# Patient Record
Sex: Female | Born: 1965 | Race: Asian | Hispanic: No | Marital: Married | Smoking: Never smoker
Health system: Southern US, Community
[De-identification: ages and names within clinical notes are randomized; demographics above are authoritative.]

---

## 2014-10-12 ENCOUNTER — Other Ambulatory Visit: Payer: Self-pay | Admitting: Obstetrics and Gynecology

## 2014-10-12 DIAGNOSIS — Z1231 Encounter for screening mammogram for malignant neoplasm of breast: Secondary | ICD-10-CM

## 2014-10-28 ENCOUNTER — Encounter (HOSPITAL_COMMUNITY): Payer: Self-pay

## 2014-10-28 ENCOUNTER — Ambulatory Visit (HOSPITAL_COMMUNITY)
Admission: RE | Admit: 2014-10-28 | Discharge: 2014-10-28 | Disposition: A | Discharge status: Home / Self Care | Payer: Self-pay | Source: Ambulatory Visit | Attending: Obstetrics and Gynecology | Admitting: Obstetrics and Gynecology

## 2014-10-28 VITALS — BP 102/60 | Temp 98.2°F | Ht 61.0 in | Wt 120.4 lb

## 2014-10-28 DIAGNOSIS — Z1231 Encounter for screening mammogram for malignant neoplasm of breast: Secondary | ICD-10-CM

## 2014-10-28 DIAGNOSIS — Z1239 Encounter for other screening for malignant neoplasm of breast: Secondary | ICD-10-CM

## 2014-10-28 NOTE — Patient Instructions (Addendum)
Education materials given on self-breast awareness. Explained to Heather Hancock that she did not need a Pap smear today due to last Pap smear was 09/13/2014. Let her know BCCCP will cover Pap smears every 3 years unless has a history of abnormal Pap smears. Let patient know the [facility] will follow up with her within the next couple weeks with results by letter or phone. Heather Hancock verbalized understanding. Patient escorted to mammography for a screening mammogram.  Irvine Glorioso, Kathaleen Maserhristine Poll, RN 2:00 PM

## 2014-10-28 NOTE — Addendum Note (Signed)
Encounter addended by: Christine P Brannock, RN on: 10/28/2014  2:40 PM<BR>     Documentation filed: Patient Instructions Section

## 2014-10-28 NOTE — Progress Notes (Signed)
No complaints today.  Pap Smear:  Pap smear not completed today. Last Pap smear was 09/13/2014 at the free cervical cancer screening [facility] and normal. Per patient has no history of an abnormal Pap smear. No Pap smear results in EPIC.  Physical exam: Breasts Breasts symmetrical. No skin abnormalities bilateral breasts. No nipple retraction bilateral breasts. No nipple discharge bilateral breasts. No lymphadenopathy. No lumps palpated bilateral breasts. No complaints of pain or tenderness on exam. Patient escorted to mammography for a screening mammogram.        Pelvic/Bimanual No Pap smear completed today since last Pap smear was 09/13/2014. Pap smear not indicated per BCCCP guidelines.

## 2014-11-08 ENCOUNTER — Ambulatory Visit: Payer: Self-pay | Attending: Internal Medicine | Admitting: Internal Medicine

## 2014-11-08 ENCOUNTER — Encounter: Payer: Self-pay | Admitting: Internal Medicine

## 2014-11-08 VITALS — BP 122/79 | HR 60 | Temp 98.4°F | Resp 16 | Ht 61.0 in | Wt 122.0 lb

## 2014-11-08 DIAGNOSIS — Z0289 Encounter for other administrative examinations: Secondary | ICD-10-CM

## 2014-11-08 LAB — CBC WITH DIFFERENTIAL/PLATELET
Basophils Absolute: 0 10*3/uL (ref 0.0–0.1)
Basophils Relative: 0 % (ref 0–1)
EOS PCT: 3 % (ref 0–5)
Eosinophils Absolute: 0.2 10*3/uL (ref 0.0–0.7)
HEMATOCRIT: 40.9 % (ref 36.0–46.0)
HEMOGLOBIN: 13.8 g/dL (ref 12.0–15.0)
LYMPHS ABS: 2.1 10*3/uL (ref 0.7–4.0)
Lymphocytes Relative: 32 % (ref 12–46)
MCH: 29.7 pg (ref 26.0–34.0)
MCHC: 33.7 g/dL (ref 30.0–36.0)
MCV: 88.1 fL (ref 78.0–100.0)
MONO ABS: 0.3 10*3/uL (ref 0.1–1.0)
MONOS PCT: 5 % (ref 3–12)
MPV: 9.8 fL (ref 8.6–12.4)
NEUTROS PCT: 60 % (ref 43–77)
Neutro Abs: 4 10*3/uL (ref 1.7–7.7)
PLATELETS: 250 10*3/uL (ref 150–400)
RBC: 4.64 MIL/uL (ref 3.87–5.11)
RDW: 13.6 % (ref 11.5–15.5)
WBC: 6.7 10*3/uL (ref 4.0–10.5)

## 2014-11-08 LAB — COMPLETE METABOLIC PANEL WITH GFR
ALT: 24 U/L (ref 0–35)
AST: 25 U/L (ref 0–37)
Albumin: 3.7 g/dL (ref 3.5–5.2)
Alkaline Phosphatase: 62 U/L (ref 39–117)
BUN: 12 mg/dL (ref 6–23)
CALCIUM: 9.5 mg/dL (ref 8.4–10.5)
CHLORIDE: 101 meq/L (ref 96–112)
CO2: 26 mEq/L (ref 19–32)
Creat: 0.63 mg/dL (ref 0.50–1.10)
GFR, Est Non African American: >89 mL/min
GLUCOSE: 94 mg/dL (ref 70–99)
Potassium: 3.8 mEq/L (ref 3.5–5.3)
Sodium: 136 mEq/L (ref 135–145)
Total Bilirubin: 0.6 mg/dL (ref 0.2–1.2)
Total Protein: 6.5 g/dL (ref 6.0–8.3)

## 2014-11-08 NOTE — Progress Notes (Signed)
Patient ID: Heather Hancock, female   DOB: 10/01/1965, 49 y.o.   MRN: [id number]  [id number]  [id number]  DOB - May 07, 1966  CC:  Chief Complaint  Patient presents with  . Establish Care       HPI: Heather Hancock is a 49 y.o. [place] female here today to establish medical care.  Patient has no past medical history. She presents today to have immigration papers filled out. She recently moved to the [place] 3 months ago and states that she was never seen by the Health Department to begin a series of vaccines. She does not remember getting childhood vaccines in her country. She has no complaints today.   Patient has No headache, No chest pain, No abdominal pain - No Nausea, No new weakness tingling or numbness, No Cough - SOB.  Allergies  Allergen Reactions  . Penicillins    History reviewed. No pertinent past medical history. No current outpatient prescriptions on file prior to visit.   No current facility-administered medications on file prior to visit.   Family History  Problem Relation Age of Onset  . Breast cancer Sister    History   Social History  . Marital Status: Married    Spouse Name: N/A  . Number of Children: N/A  . Years of Education: N/A   Occupational History  . Not on file.   Social History Main Topics  . Smoking status: Never Smoker   . Smokeless tobacco: Never Used  . Alcohol Use: Yes     Comment: rarely  . Drug Use: No  . Sexual Activity: Yes    Birth Control/ Protection: None   Other Topics Concern  . Not on file   Social History Narrative    Review of Systems: Constitutional: Negative for fever, chills, diaphoresis, activity change, appetite change and fatigue. HENT: Negative for ear pain, nosebleeds, congestion, facial swelling, rhinorrhea, neck pain, neck stiffness and ear discharge.  Eyes: Negative for pain, discharge, redness, itching and visual disturbance. Respiratory: Negative for cough, choking, chest tightness, shortness of  breath, wheezing and stridor.  Cardiovascular: Negative for chest pain, palpitations and leg swelling. Gastrointestinal: Negative for abdominal distention. Genitourinary: Negative for dysuria, urgency, frequency, hematuria, flank pain, decreased urine volume, difficulty urinating and dyspareunia.  Musculoskeletal: Negative for back pain, joint swelling, arthralgia and gait problem. Neurological: Negative for dizziness, tremors, seizures, syncope, facial asymmetry, speech difficulty, weakness, light-headedness, numbness and headaches.  Hematological: Negative for adenopathy. Does not bruise/bleed easily. Psychiatric/Behavioral: Negative for hallucinations, behavioral problems, confusion, dysphoric mood, decreased concentration and agitation.    Objective:   Filed Vitals:   11/08/14 1418  BP: 122/79  Pulse: 60  Temp: 98.4 F (36.9 C)  Resp: 16    Physical Exam: Constitutional: Patient appears well-developed and well-nourished. No distress. HENT: Normocephalic, atraumatic, External right and left ear normal. Oropharynx is clear and moist.  Eyes: Conjunctivae and EOM are normal. PERRLA, no scleral icterus. Neck: Normal ROM. Neck supple. No JVD. No tracheal deviation. No thyromegaly. CVS: RRR, S1/S2 +, no murmurs, no gallops, no carotid bruit.  Pulmonary: Effort and breath sounds normal, no stridor, rhonchi, wheezes, rales.  Abdominal: Soft. BS +, no distension, tenderness, rebound or guarding.  Musculoskeletal: Normal range of motion. No edema and no tenderness.  Lymphadenopathy: No lymphadenopathy noted, cervical, inguinal or axillary Neuro: Alert. Normal reflexes, muscle tone coordination. No cranial nerve deficit. Skin: Skin is warm and dry. No rash noted. Not diaphoretic. No erythema. No pallor. Psychiatric: Normal mood and affect. Behavior, judgment,  thought content normal.  No results found for: WBC, HGB, HCT, MCV, PLT No results found for: CREATININE, BUN, NA, K, CL, CO2  No  results found for: HGBA1C Lipid Panel  No results found for: CHOL, TRIG, HDL, CHOLHDL, VLDL, LDLCALC     Assessment and plan:   [name] was seen today for establish care.  Diagnoses and all orders for this visit:  History and physical examination, immigration Orders: -     PPD -     HIV antibody (with reflex) -     RPR -     CBC with Differential -     COMPLETE METABOLIC PANEL WITH GFR Will get PPD injection today and blood work for immigration papers. I [facility] to begin a series of vaccinations. She will obtain records of these and I then may sign off on her immigration forms.   Return in about 2 days (around 11/10/2014) for Nurse-PPD read.    Holland CommonsKECK, VALERIE, NP-C [facility] [phone] 11/08/2014, 2:28 PM

## 2014-11-08 NOTE — Progress Notes (Signed)
Pt is here to establish care. Pt has immigration papers for the doctor to fill out. Pt C.C. She has scaly skin on her fingers.

## 2014-11-08 NOTE — Patient Instructions (Signed)
Please obtain records from last pap smear  [address] [facility]. [phone].  Make a appointment with immigration office there and get set up for shots.    Once we have this completed we may start on filling out the forms

## 2014-11-09 LAB — RPR

## 2014-11-09 LAB — HIV ANTIBODY (ROUTINE TESTING W REFLEX): HIV 1&2 Ab, 4th Generation: NONREACTIVE

## 2014-11-10 ENCOUNTER — Ambulatory Visit (HOSPITAL_COMMUNITY)
Admission: RE | Admit: 2014-11-10 | Discharge: 2014-11-10 | Disposition: A | Discharge status: Home / Self Care | Payer: Self-pay | Source: Ambulatory Visit | Attending: Internal Medicine | Admitting: Internal Medicine

## 2014-11-10 ENCOUNTER — Ambulatory Visit: Payer: Self-pay | Attending: Internal Medicine

## 2014-11-10 DIAGNOSIS — R7611 Nonspecific reaction to tuberculin skin test without active tuberculosis: Secondary | ICD-10-CM

## 2014-11-10 LAB — TB SKIN TEST: TB Skin Test: POSITIVE

## 2014-11-12 ENCOUNTER — Telehealth: Payer: Self-pay | Admitting: *Deleted

## 2014-11-12 NOTE — Telephone Encounter (Signed)
-----   Message from Ambrose FinlandValerie A Keck, NP sent at 11/09/2014 11:47 AM EDT ----- All blood work is normal.

## 2014-11-12 NOTE — Telephone Encounter (Signed)
Pt is aware of her xray and lab results.

## 2014-11-15 ENCOUNTER — Telehealth: Payer: Self-pay | Admitting: Internal Medicine

## 2014-11-15 NOTE — Telephone Encounter (Signed)
Patient called to request results, please f/u with pt. °

## 2014-11-15 NOTE — Telephone Encounter (Signed)
Please call and inform her of results

## 2014-11-17 ENCOUNTER — Encounter: Payer: Self-pay | Admitting: Internal Medicine

## 2014-11-17 NOTE — Telephone Encounter (Signed)
Error

## 2014-11-19 ENCOUNTER — Ambulatory Visit: Payer: Self-pay | Admitting: Internal Medicine

## 2014-11-22 ENCOUNTER — Ambulatory Visit: Payer: Self-pay | Admitting: Internal Medicine

## 2014-12-03 ENCOUNTER — Telehealth: Payer: Self-pay | Admitting: Internal Medicine

## 2014-12-03 NOTE — Telephone Encounter (Signed)
Patient has come in today to pick up her completed paperwork;

## 2014-12-15 ENCOUNTER — Ambulatory Visit: Payer: Self-pay

## 2015-10-31 ENCOUNTER — Other Ambulatory Visit: Payer: Self-pay | Admitting: Obstetrics and Gynecology

## 2015-10-31 DIAGNOSIS — Z1231 Encounter for screening mammogram for malignant neoplasm of breast: Secondary | ICD-10-CM

## 2015-11-03 ENCOUNTER — Encounter (HOSPITAL_COMMUNITY): Payer: Self-pay

## 2015-11-03 ENCOUNTER — Ambulatory Visit
Admission: RE | Admit: 2015-11-03 | Discharge: 2015-11-03 | Disposition: A | Discharge status: Home / Self Care | Payer: No Typology Code available for payment source | Source: Ambulatory Visit | Attending: Obstetrics and Gynecology | Admitting: Obstetrics and Gynecology

## 2015-11-03 ENCOUNTER — Ambulatory Visit (HOSPITAL_COMMUNITY)
Admission: RE | Admit: 2015-11-03 | Discharge: 2015-11-03 | Disposition: A | Discharge status: Home / Self Care | Payer: Self-pay | Source: Ambulatory Visit | Attending: Obstetrics and Gynecology | Admitting: Obstetrics and Gynecology

## 2015-11-03 VITALS — BP 104/60 | Temp 97.6°F | Ht 59.0 in | Wt 118.0 lb

## 2015-11-03 DIAGNOSIS — Z1239 Encounter for other screening for malignant neoplasm of breast: Secondary | ICD-10-CM

## 2015-11-03 DIAGNOSIS — Z1231 Encounter for screening mammogram for malignant neoplasm of breast: Secondary | ICD-10-CM

## 2015-11-03 NOTE — Progress Notes (Signed)
Patient complained that she feels a lump within her left breast and has occasional tenderness. Patient states the lump has been there for years with no changes. Patients last mammogram 10/28/2014 was negative.  Pap Smear: Pap smear not completed today. Last Pap smear was 09/13/2014 at the free cervical cancer screening at [facility] and normal. Per patient has no history of an abnormal Pap smear. No Pap smear results in EPIC.  Physical exam: Breasts Breasts symmetrical. No skin abnormalities bilateral breasts. No nipple retraction bilateral breasts. No nipple discharge bilateral breasts. No lymphadenopathy. No lumps palpated bilateral breasts. No lump palpated in patients area of concern. Complaints of left outer breast tenderness on exam that per patient has been like that since before her last mammogram. [facility] mamm[facility]. Appointment scheduled for Thursday, November 03, 2015 at 1630.    Pelvic/Bimanual No Pap smear completed today since last Pap smear was 09/13/2014. Pap smear not indicated per BCCCP guidelines.   Smoking History: Patient has never smoked.  Patient Navigation: Patient education provided. Access to services provided [facility].   Colorectal Cancer Screening: Patient has never had a colonoscopy. No complaints today. Patient will follow up with PCP at [facility] for referral.

## 2015-11-03 NOTE — Patient Instructions (Signed)
Educational materials on self breast awareness given. Explained to [name] that she did not need a Pap smear today due to last Pap smear was 09/13/2014. Let her know BCCCP will cover Pap smears every 3 years unless has a history of abnormal Pap smears. [facility] mamm[facility]. Appointment scheduled for Thursday, November 03, 2015 at 1630. Patient aware of appointment and will be there. Let patient know the Breast Center will follow up with her within the next couple weeks with results of mammogram by letter or phone. [name] verbalized understanding.  Rusell Meneely, Kathaleen Maserhristine Poll, RN 3:48 PM

## 2015-11-09 ENCOUNTER — Encounter (HOSPITAL_COMMUNITY): Payer: Self-pay | Admitting: *Deleted

## 2017-08-25 IMAGING — MG MS DIGITAL SCREENING BILATERAL
4 series · 4 of 4 positions shown · non-contrast
Comparison: Previous exam(s).

CLINICAL DATA: Screening.

EXAM:
DIGITAL SCREENING BILATERAL MAMMOGRAM WITH CAD

[R CC]
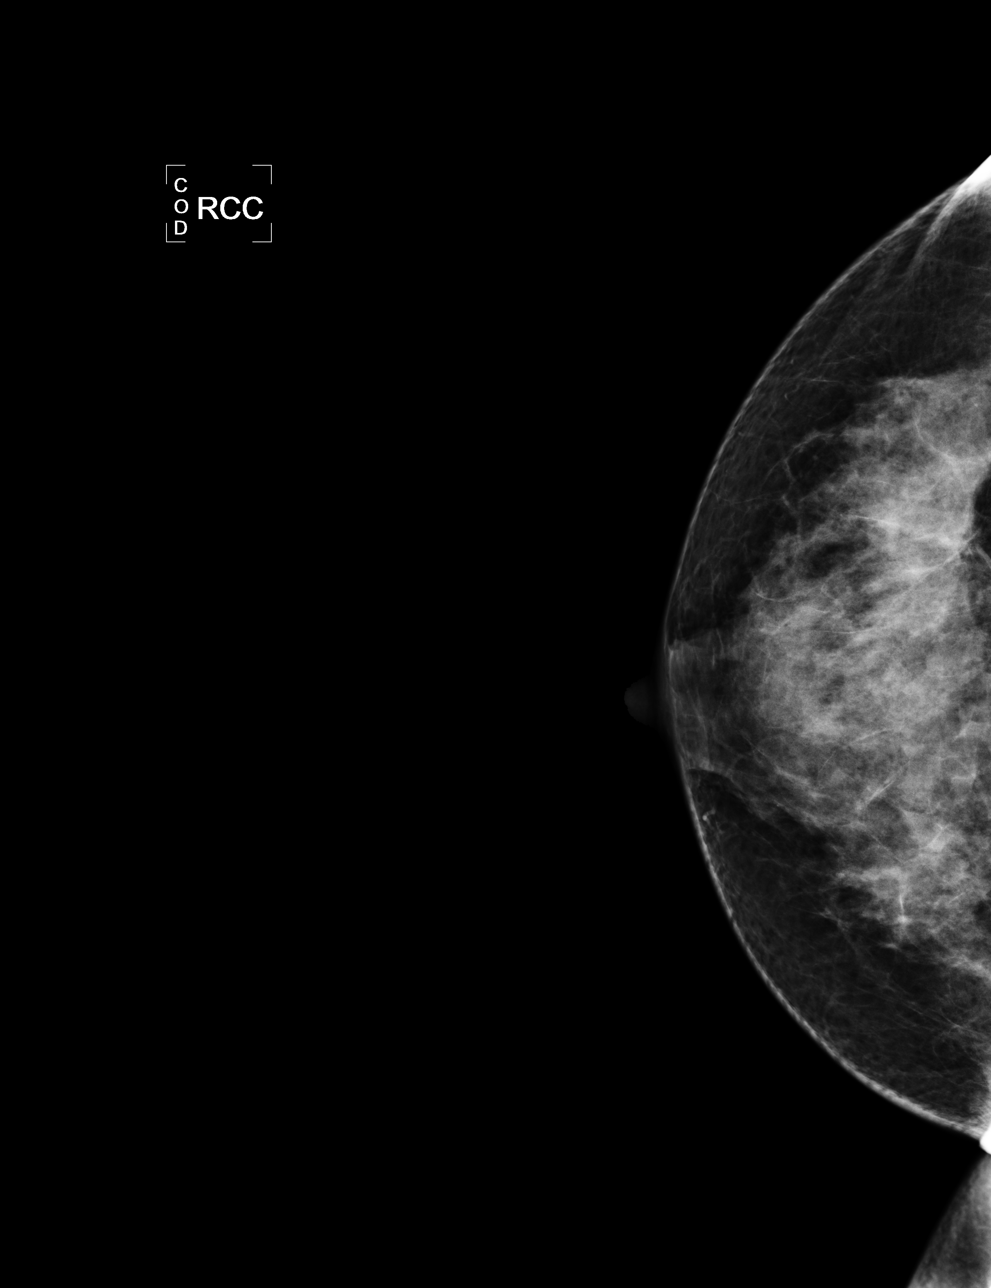

[L CC]
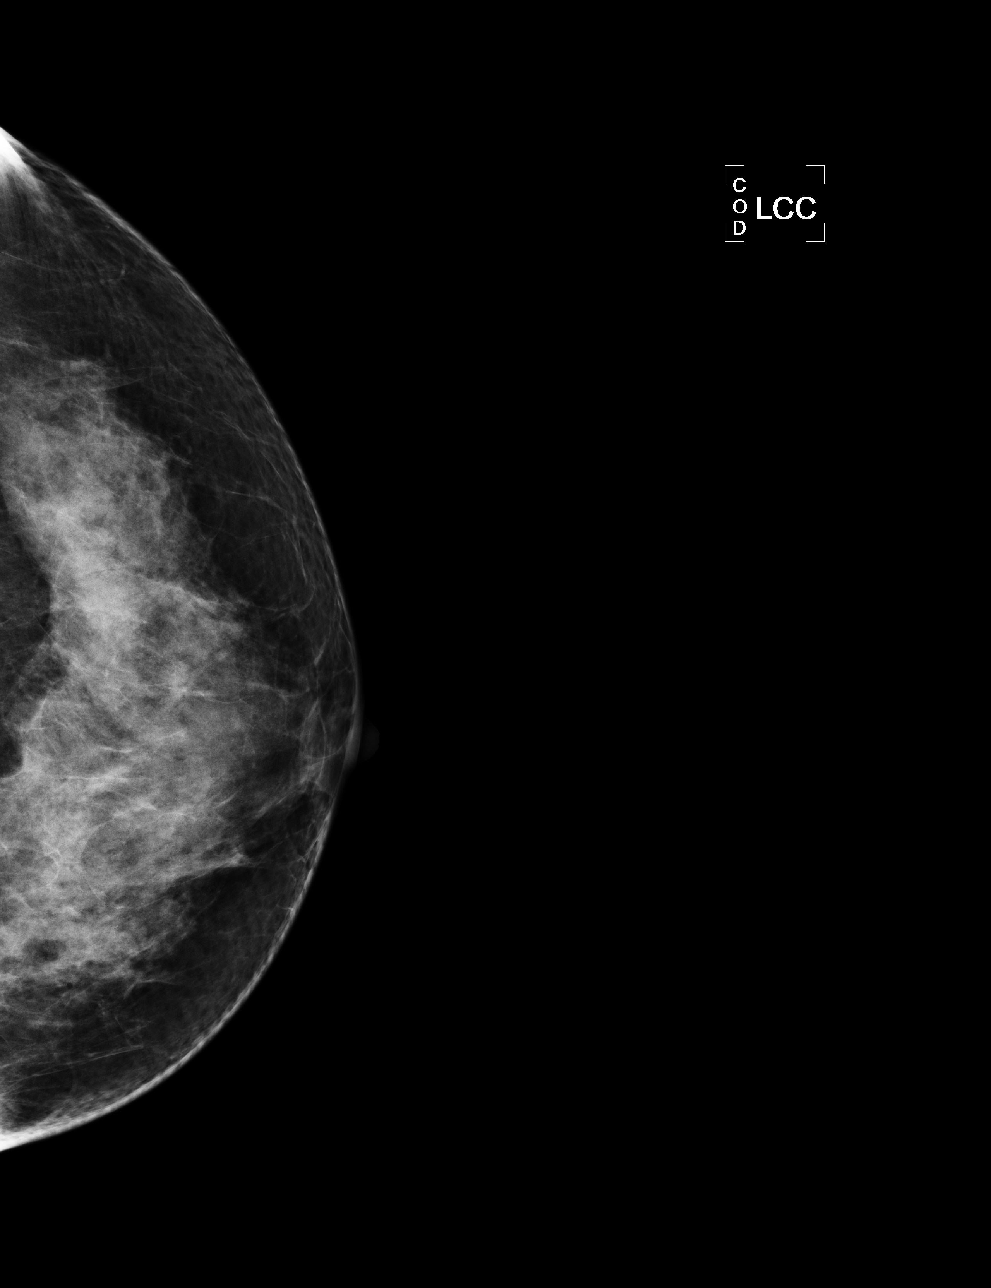

[R MLO]
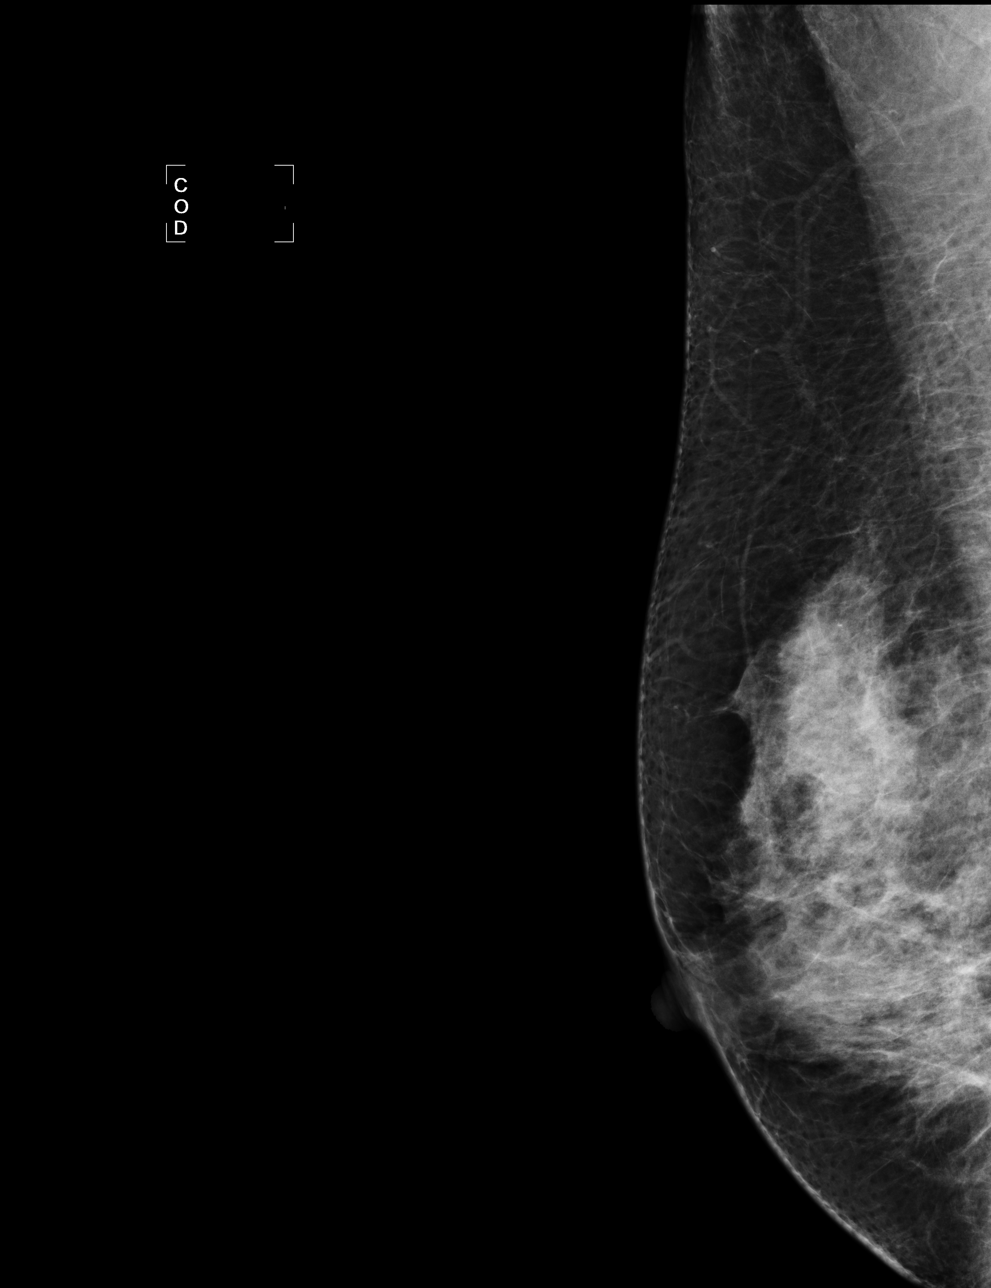

[L MLO]
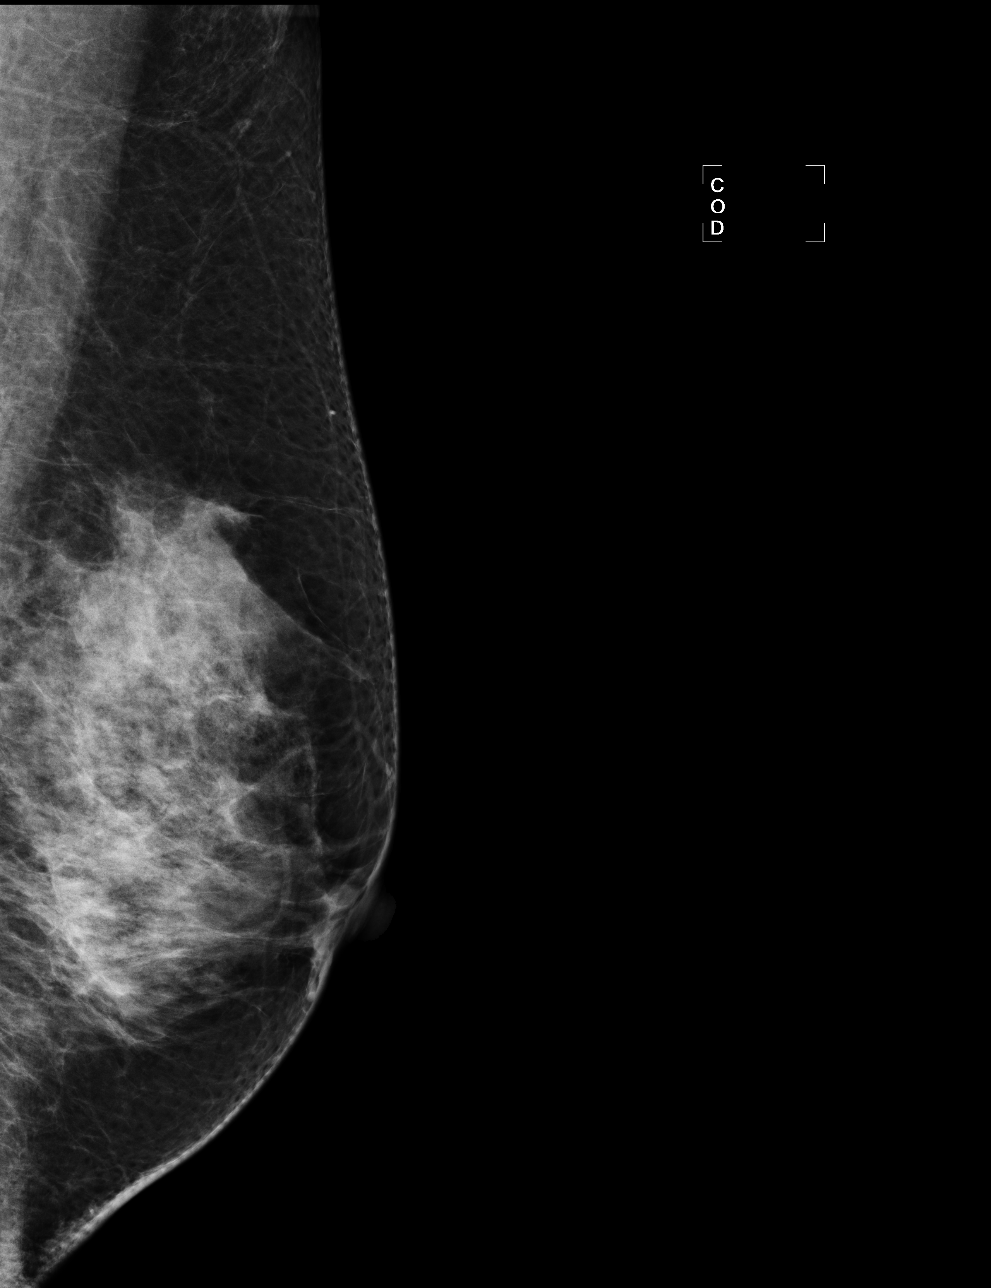

[4 of 4 positions shown; findings below may reference images not displayed]

ACR Breast Density Category d: The breast tissue is extremely dense,
which lowers the sensitivity of mammography.
FINDINGS: There are no findings suspicious for malignancy. Images were
processed with CAD.
IMPRESSION: No mammographic evidence of malignancy. A result letter of this
screening mammogram will be mailed directly to the patient.

RECOMMENDATION:
Screening mammogram in one year. (Code:BD-[id number])

BI-RADS CATEGORY  1: Negative.

## 2018-05-30 ENCOUNTER — Other Ambulatory Visit (HOSPITAL_COMMUNITY): Payer: Self-pay | Admitting: *Deleted

## 2018-05-30 DIAGNOSIS — N6320 Unspecified lump in the left breast, unspecified quadrant: Secondary | ICD-10-CM

## 2018-06-25 ENCOUNTER — Telehealth (HOSPITAL_COMMUNITY): Payer: Self-pay | Admitting: *Deleted

## 2018-06-25 NOTE — Telephone Encounter (Signed)
Telephoned patient, was told by patient's husband that patient was out of the country and would not be back until September 15, 2018.

## 2018-06-26 ENCOUNTER — Other Ambulatory Visit: Payer: No Typology Code available for payment source

## 2018-06-26 ENCOUNTER — Ambulatory Visit (HOSPITAL_COMMUNITY): Payer: No Typology Code available for payment source
